# Patient Record
Sex: Female | Born: 2000 | Race: White | Hispanic: No | Marital: Single | Smoking: Never smoker
Health system: Southern US, Community
[De-identification: ages and names within clinical notes are randomized; demographics above are authoritative.]

## PROBLEM LIST (undated history)

## (undated) DIAGNOSIS — R112 Nausea with vomiting, unspecified: Secondary | ICD-10-CM

## (undated) DIAGNOSIS — S99919A Unspecified injury of unspecified ankle, initial encounter: Secondary | ICD-10-CM

## (undated) DIAGNOSIS — K9041 Non-celiac gluten sensitivity: Secondary | ICD-10-CM

## (undated) DIAGNOSIS — R011 Cardiac murmur, unspecified: Secondary | ICD-10-CM

## (undated) DIAGNOSIS — Z9889 Other specified postprocedural states: Secondary | ICD-10-CM

## (undated) DIAGNOSIS — R519 Headache, unspecified: Secondary | ICD-10-CM

## (undated) DIAGNOSIS — M25571 Pain in right ankle and joints of right foot: Secondary | ICD-10-CM

## (undated) DIAGNOSIS — M24071 Loose body in right ankle: Secondary | ICD-10-CM

## (undated) DIAGNOSIS — T148XXA Other injury of unspecified body region, initial encounter: Secondary | ICD-10-CM

## (undated) HISTORY — DX: Loose body in right ankle: M24.071

## (undated) HISTORY — DX: Pain in right ankle and joints of right foot: M25.571

---
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
## 2018-01-06 ENCOUNTER — Emergency Department
Admission: EM | Admit: 2018-01-06 | Discharge: 2018-01-06 | Disposition: A | Discharge status: Home / Self Care | Payer: 59 | Attending: Emergency Medicine | Admitting: Emergency Medicine

## 2018-01-06 ENCOUNTER — Emergency Department: Payer: 59

## 2018-01-06 ENCOUNTER — Encounter: Payer: Self-pay | Admitting: Emergency Medicine

## 2018-01-06 ENCOUNTER — Other Ambulatory Visit: Payer: Self-pay

## 2018-01-06 DIAGNOSIS — R51 Headache: Secondary | ICD-10-CM | POA: Diagnosis not present

## 2018-01-06 DIAGNOSIS — M542 Cervicalgia: Secondary | ICD-10-CM | POA: Insufficient documentation

## 2018-01-06 DIAGNOSIS — R519 Headache, unspecified: Secondary | ICD-10-CM

## 2018-01-06 DIAGNOSIS — R509 Fever, unspecified: Secondary | ICD-10-CM | POA: Insufficient documentation

## 2018-01-06 DIAGNOSIS — J029 Acute pharyngitis, unspecified: Secondary | ICD-10-CM | POA: Diagnosis not present

## 2018-01-06 LAB — CBC WITH DIFFERENTIAL/PLATELET
Abs Immature Granulocytes: 0.04 10*3/uL (ref 0.00–0.07)
BASOS ABS: 0 10*3/uL (ref 0.0–0.1)
Basophils Relative: 0 %
EOS ABS: 0.1 10*3/uL (ref 0.0–0.5)
EOS PCT: 1 %
HCT: 41 % (ref 36.0–46.0)
HEMOGLOBIN: 13.8 g/dL (ref 12.0–15.0)
Immature Granulocytes: 0 %
LYMPHS PCT: 4 %
Lymphs Abs: 0.4 10*3/uL — ABNORMAL LOW (ref 0.7–4.0)
MCH: 31.1 pg (ref 26.0–34.0)
MCHC: 33.7 g/dL (ref 30.0–36.0)
MCV: 92.3 fL (ref 80.0–100.0)
MONO ABS: 1 10*3/uL (ref 0.1–1.0)
Monocytes Relative: 10 %
NRBC: 0 % (ref 0.0–0.2)
Neutro Abs: 8.1 10*3/uL — ABNORMAL HIGH (ref 1.7–7.7)
Neutrophils Relative %: 85 %
Platelets: 149 10*3/uL — ABNORMAL LOW (ref 150–400)
RBC: 4.44 MIL/uL (ref 3.87–5.11)
RDW: 11.9 % (ref 11.5–15.5)
WBC: 9.6 10*3/uL (ref 4.0–10.5)

## 2018-01-06 LAB — URINALYSIS, COMPLETE (UACMP) WITH MICROSCOPIC
Bacteria, UA: NONE SEEN
Bilirubin Urine: NEGATIVE
Glucose, UA: NEGATIVE mg/dL
Ketones, ur: 20 mg/dL — AB
Leukocytes, UA: NEGATIVE
Nitrite: NEGATIVE
Protein, ur: NEGATIVE mg/dL
Specific Gravity, Urine: 1.014 (ref 1.005–1.030)
pH: 7 (ref 5.0–8.0)

## 2018-01-06 LAB — PROTEIN AND GLUCOSE, CSF
Glucose, CSF: 49 mg/dL (ref 40–70)
Total  Protein, CSF: 23 mg/dL (ref 15–45)

## 2018-01-06 LAB — CSF CELL COUNT WITH DIFFERENTIAL
EOS CSF: 0 %
Eosinophils, CSF: 0 %
Lymphs, CSF: 0 %
Lymphs, CSF: 0 %
MONOCYTE-MACROPHAGE-SPINAL FLUID: 0 %
Monocyte-Macrophage-Spinal Fluid: 0 %
OTHER CELLS CSF: 0
Other Cells, CSF: 0
RBC Count, CSF: 2 /mm3 (ref 0–3)
RBC Count, CSF: 2 /mm3 (ref 0–3)
SEGMENTED NEUTROPHILS-CSF: 0 %
Segmented Neutrophils-CSF: 0 %
Tube #: 1
Tube #: 3
WBC CSF: 0 /mm3 (ref 0–5)
WBC, CSF: 0 /mm3 (ref 0–5)

## 2018-01-06 LAB — GROUP A STREP BY PCR: Group A Strep by PCR: NOT DETECTED

## 2018-01-06 LAB — COMPREHENSIVE METABOLIC PANEL WITH GFR
ALT: 19 U/L (ref 0–44)
AST: 24 U/L (ref 15–41)
Albumin: 4.2 g/dL (ref 3.5–5.0)
Alkaline Phosphatase: 51 U/L (ref 38–126)
Anion gap: 9 (ref 5–15)
BUN: 9 mg/dL (ref 6–20)
CO2: 25 mmol/L (ref 22–32)
Calcium: 9.2 mg/dL (ref 8.9–10.3)
Chloride: 104 mmol/L (ref 98–111)
Creatinine, Ser: 0.87 mg/dL (ref 0.44–1.00)
GFR calc Af Amer: >60 mL/min (ref >60)
GFR calc non Af Amer: >60 mL/min (ref >60)
Glucose, Bld: 105 mg/dL — ABNORMAL HIGH (ref 70–99)
Potassium: 3.9 mmol/L (ref 3.5–5.1)
Sodium: 138 mmol/L (ref 135–145)
Total Bilirubin: 0.6 mg/dL (ref 0.3–1.2)
Total Protein: 7.2 g/dL (ref 6.5–8.1)

## 2018-01-06 LAB — MONONUCLEOSIS SCREEN: Mono Screen: NEGATIVE

## 2018-01-06 LAB — LACTIC ACID, PLASMA: Lactic Acid, Venous: 0.9 mmol/L (ref 0.5–1.9)

## 2018-01-06 LAB — INFLUENZA PANEL BY PCR (TYPE A & B)
INFLBPCR: NEGATIVE
Influenza A By PCR: NEGATIVE

## 2018-01-06 LAB — POCT PREGNANCY, URINE: Preg Test, Ur: NEGATIVE

## 2018-01-06 MED ORDER — DIPHENHYDRAMINE HCL 50 MG/ML IJ SOLN
12.5000 mg | Freq: Once | INTRAMUSCULAR | Status: AC
  Administered 2018-01-06: 12.5 mg via INTRAVENOUS
  Filled 2018-01-06: qty 1

## 2018-01-06 MED ORDER — ACETAMINOPHEN 325 MG PO TABS
650.0000 mg | ORAL_TABLET | Freq: Once | ORAL | Status: AC | PRN
  Administered 2018-01-06: 650 mg via ORAL

## 2018-01-06 MED ORDER — SODIUM CHLORIDE 0.9 % IV BOLUS
1000.0000 mL | Freq: Once | INTRAVENOUS | Status: AC
  Administered 2018-01-06: 1000 mL via INTRAVENOUS

## 2018-01-06 MED ORDER — PROCHLORPERAZINE MALEATE 5 MG PO TABS
5.0000 mg | ORAL_TABLET | Freq: Once | ORAL | Status: AC
  Administered 2018-01-06: 5 mg via ORAL
  Filled 2018-01-06: qty 1

## 2018-01-06 MED ORDER — LIDOCAINE HCL (PF) 1 % IJ SOLN
30.0000 mL | Freq: Once | INTRAMUSCULAR | Status: AC
  Administered 2018-01-06: 30 mL via INTRADERMAL
  Filled 2018-01-06: qty 30

## 2018-01-06 MED ORDER — ACETAMINOPHEN 325 MG PO TABS
ORAL_TABLET | ORAL | Status: AC
  Filled 2018-01-06: qty 2

## 2018-01-06 MED ORDER — IBUPROFEN 800 MG PO TABS
800.0000 mg | ORAL_TABLET | Freq: Once | ORAL | Status: AC
  Administered 2018-01-06: 800 mg via ORAL
  Filled 2018-01-06: qty 1

## 2018-01-06 MED ORDER — ONDANSETRON HCL 4 MG/2ML IJ SOLN
4.0000 mg | Freq: Once | INTRAMUSCULAR | Status: AC
  Administered 2018-01-06: 4 mg via INTRAVENOUS
  Filled 2018-01-06: qty 2

## 2018-01-06 MED ORDER — FENTANYL CITRATE (PF) 100 MCG/2ML IJ SOLN
50.0000 ug | Freq: Once | INTRAMUSCULAR | Status: AC
  Administered 2018-01-06: 50 ug via INTRAVENOUS
  Filled 2018-01-06: qty 2

## 2018-01-06 MED ORDER — KETOROLAC TROMETHAMINE 30 MG/ML IJ SOLN
30.0000 mg | Freq: Once | INTRAMUSCULAR | Status: AC
  Administered 2018-01-06: 30 mg via INTRAVENOUS
  Filled 2018-01-06: qty 1

## 2018-01-06 NOTE — Discharge Instructions (Addendum)
You may continue to take Tylenol and Motrin for pain and fever.  Drink plenty of fluid to stay well hydrated.  Return to the emergency department if you develop the inability to keep down fluids, severe pain, or any other symptoms concerning to you.

## 2018-01-06 NOTE — ED Provider Notes (Signed)
[facility] Emergency Department Provider Note  ____________________________________________  Time seen: Approximately 4:28 PM  I have reviewed the triage vital signs and the nursing notes.   HISTORY  Chief Complaint Fever    HPI Lauren Rasmussen is a 17 y.o. female, otherwise healthy, presenting from urgent care for fever, sore throat, headache, neck pain.  The patient reports that 4 days ago, she developed a headache similar to her prior migraines.  However, she took sumatriptan and Motrin and her headache did not resolve as her normal migraines would.  She then developed some posterior neck pain, low back pain, and since has developed some sore throat.  She has not had any congestion or rhinorrhea.  Today, when she took a shower, she had blackening of her vision without a true syncopal episode, and did have a vomiting episode.  She has had fevers as high as 103.0.  She has not been having any abdominal pain, diarrhea or constipation.  LMP was last week.  There is someone with mononucleosis living in her dorm.  No tick bites or travel outside the [place].  SH: Consulting civil engineer.  Denies sexual activity.  History reviewed. No pertinent past medical history.  There are no active problems to display for this patient.   History reviewed. No pertinent surgical history.  Current Outpatient Rx  . Order #: [id number] Class: Historical Med  . Order #: [id number] Class: Historical Med    Allergies Gluten meal  No family history on file.  Social History Social History   Tobacco Use  . Smoking status: Never Smoker  . Smokeless tobacco: Never Used  Substance Use Topics  . Alcohol use: Not Currently  . Drug use: Never    Review of Systems Constitutional: Positive fever and general malaise. Eyes: No visual changes.  No eye discharge. ENT: Positive sore throat. No congestion or rhinorrhea.  No ear pain. Cardiovascular: Denies chest pain. Denies  palpitations. Respiratory: Denies shortness of breath.  No cough. Gastrointestinal: No abdominal pain.  Positive nausea, positive vomiting.  No diarrhea.  No constipation. Genitourinary: Negative for dysuria.  No urinary frequency.  LMP last week. Musculoskeletal: Positive diffuse back pain and neck pain.. Skin: Negative for rash. Neurological: Positive for headache. No focal numbness, tingling or weakness.     ____________________________________________   PHYSICAL EXAM:  VITAL SIGNS: ED Triage Vitals  Enc Vitals Group     BP 01/06/18 1239 122/64     Pulse Rate 01/06/18 1239 (!) 124     Resp 01/06/18 1239 (!) 22     Temp 01/06/18 1239 (!) 100.9 F (38.3 C)     Temp Source 01/06/18 1239 Oral     SpO2 01/06/18 1239 100 %     Weight 01/06/18 1239 155 lb (70.3 kg)     Height 01/06/18 1239 5\' 8"  (1.727 m)     Head Circumference --      Peak Flow --      Pain Score 01/06/18 1242 10     Pain Loc --      Pain Edu? --      Excl. in GC? --     Constitutional: Alert and oriented. Answers questions appropriately.  The patient is mildly uncomfortable but able to move about without significant difficulty. Eyes: Conjunctivae are injected bilaterally..  EOMI. No scleral icterus.  No eye discharge. Head: Atraumatic. Nose: Positive congestion without rhinorrhea. Mouth/Throat: Mucous membranes are moist.  The patient has posterior pharyngeal erythema with tonsillar swelling and exudate seen on both  tonsils.  The posterior palate is symmetric and the uvula is midline. EARS: TMs are clear without bulge, erythema or fluid bilaterally.  The canals are clear as well. Neck: No stridor.  Supple.  The patient has mild discomfort but is able to range her head in all directions without any severe pain.  No true meningismus on examination. Cardiovascular: Normal rate, regular rhythm. No murmurs, rubs or gallops.  Respiratory: Normal respiratory effort.  No accessory muscle use or retractions. Lungs  CTAB.  No wheezes, rales or ronchi. Gastrointestinal: Soft, and nondistended.  Bilateral nonspecific upper abdominal discomfort on examination.  Negative Murphy sign.  No guarding or rebound.  No peritoneal signs. Musculoskeletal: No LE edema. Neurologic:  A&Ox3.  Speech is clear.  Face and smile are symmetric.  EOMI.  Moves all extremities well. Skin:  Skin is warm, dry and intact. No rash noted. Psychiatric: Mood and affect are normal. Speech and behavior are normal.  Normal judgement.  ____________________________________________   LABS (all labs ordered are listed, but only abnormal results are displayed)  Labs Reviewed  COMPREHENSIVE METABOLIC PANEL - Abnormal; Notable for the following components:      Result Value   Glucose, Bld 105 (*)    All other components within normal limits  CBC WITH DIFFERENTIAL/PLATELET - Abnormal; Notable for the following components:   Platelets 149 (*)    Neutro Abs 8.1 (*)    Lymphs Abs 0.4 (*)    All other components within normal limits  URINALYSIS, COMPLETE (UACMP) WITH MICROSCOPIC - Abnormal; Notable for the following components:   Color, Urine YELLOW (*)    APPearance CLEAR (*)    Hgb urine dipstick SMALL (*)    Ketones, ur 20 (*)    Non Squamous Epithelial PRESENT (*)    All other components within normal limits  CSF CELL COUNT WITH DIFFERENTIAL - Abnormal; Notable for the following components:   Color, CSF CLEAR (*)    Appearance, CSF COLORLESS (*)    All other components within normal limits  CSF CELL COUNT WITH DIFFERENTIAL - Abnormal; Notable for the following components:   Color, CSF CLEAR (*)    Appearance, CSF COLORLESS (*)    All other components within normal limits  GROUP A STREP BY PCR  CSF CULTURE  LACTIC ACID, PLASMA  INFLUENZA PANEL BY PCR (TYPE A & B)  MONONUCLEOSIS SCREEN  PROTEIN AND GLUCOSE, CSF  LACTIC ACID, PLASMA  HERPES SIMPLEX VIRUS(HSV) DNA BY PCR  POC URINE PREG, ED  POCT PREGNANCY, URINE    ____________________________________________  EKG  ED ECG REPORT I, Anne-Caroline Sharma Covert, the attending physician, personally viewed and interpreted this ECG.   Date: 01/06/2018  EKG Time: 1637  Rate: 87  Rhythm: normal sinus rhythm  Axis: normal  Intervals:none  ST&T Change: No STEMI  ____________________________________________  RADIOLOGY  Dg Chest 2 View  Result Date: 01/06/2018 CLINICAL DATA:  Back pain, headache, fever, chills and neck pain for the past 2-3 days. EXAM: CHEST - 2 VIEW COMPARISON:  None. FINDINGS: The heart size and mediastinal contours are within normal limits. Both lungs are clear. The visualized skeletal structures are unremarkable. IMPRESSION: Normal examination. Electronically Signed   By: Beckie Salts M.D.   On: 01/06/2018 13:13    ____________________________________________   PROCEDURES  Procedure(s) performed: None  .Lumbar Puncture Date/Time: 01/06/2018 10:00 PM Performed by: Rockne Menghini, MD Authorized by: Rockne Menghini, MD   Consent:    Consent obtained:  Verbal and written   Consent given  by:  Patient   Risks discussed:  Headache, bleeding, infection, pain and nerve damage   Alternatives discussed:  No treatment and observation Pre-procedure details:    Procedure purpose:  Diagnostic   Preparation: Patient was prepped and draped in usual sterile fashion   Anesthesia (see MAR for exact dosages):    Anesthesia method:  Local infiltration   Local anesthetic:  Lidocaine 1% w/o epi Procedure details:    Lumbar space:  L4-L5 interspace   Patient position:  R lateral decubitus   Needle gauge:  22   Needle type:  Spinal needle - Quincke tip   Ultrasound guidance: no     Number of attempts:  1   Opening pressure (cm H2O):  29 Post-procedure:    Puncture site:  Adhesive bandage applied and direct pressure applied   Patient tolerance of procedure:  Tolerated well, no immediate complications    Critical Care  performed: No ____________________________________________   INITIAL IMPRESSION / ASSESSMENT AND PLAN / ED COURSE  Pertinent labs & imaging results that were available during my care of the patient were reviewed by me and considered in my medical decision making (see chart for details).  17 y.o. female presenting with 4 days of progressively worsening headache that is not responsive to her normal migraine medications, fever to 103.0, neck discomfort and sore throat.  Overall, the patient's symptoms are most consistent with a URI and I do see evidence of erythema in the posterior pharynx.  At urgent care she did have a negative strep and influenza testing, but we will repeat her strep testing with an aggressive sample here today.  I will also check her for mononucleosis.  I have had a long discussion with the patient about meningitis, outlining viral versus bacterial meningitis.  If the patient's additional testing here is negative, including her urinalysis, and she continues to feel poorly, we will plan to proceed with lumbar puncture.  I had a thorough discussion with the patient about the risks and benefits of this procedure.  Plan reevaluation for final disposition.  ----------------------------------------- 7:42 PM on 01/06/2018 -----------------------------------------  The patient's urinalysis does not show UTI.  Her pregnancy test is also negative.  Her mono screen and strep test are also negative.  The patient states that her sore throat has improved but that her headache is completely unchanged despite fluids and Toradol.  I have spoken to the patient, as well as her mother, and will proceed with lumbar puncture to rule out meningitis today.  Additional symptomatic treatment has been ordered.  ----------------------------------------- 11:09 PM on 01/06/2018 -----------------------------------------  Patient reported that her sore throat was improving but that she was continuing to have  headache.  We did proceed with lumbar puncture, which did not yield any evidence of meningitis.  At this time, the patient is also developing some congestion with her sore throat.  Her headache is feeling better at this time.  We will plan to discharge the patient home with close follow-up with student health at [facility], and I did discuss return precautions.  I did speak with the patient's mother during her ED stay.  ____________________________________________  FINAL CLINICAL IMPRESSION(S) / ED DIAGNOSES  Final diagnoses:  Fever, unspecified fever cause  Nonintractable headache, unspecified chronicity pattern, unspecified headache type  Neck pain  Pharyngitis, unspecified etiology         NEW MEDICATIONS STARTED DURING THIS VISIT:  New Prescriptions   No medications on file      Rockne Menghini, MD 01/06/18  2311  

## 2018-01-06 NOTE — ED Notes (Signed)
Pt talking and laughing with friends

## 2018-01-06 NOTE — ED Triage Notes (Signed)
PT to ED sent from [facility] for poss meningitis. PT states back pain, headache, fever , chills and neck pain x2-3 days. VSS

## 2018-01-09 LAB — HERPES SIMPLEX VIRUS(HSV) DNA BY PCR
HSV 1 DNA: NEGATIVE
HSV 2 DNA: NEGATIVE

## 2018-01-10 LAB — CSF CULTURE W GRAM STAIN
Culture: NO GROWTH
Special Requests: NORMAL

## 2018-01-10 LAB — CSF CULTURE: GRAM STAIN: NONE SEEN

## 2018-10-06 ENCOUNTER — Encounter (INDEPENDENT_AMBULATORY_CARE_PROVIDER_SITE_OTHER): Payer: Self-pay

## 2018-10-06 ENCOUNTER — Ambulatory Visit (INDEPENDENT_AMBULATORY_CARE_PROVIDER_SITE_OTHER): Payer: No Typology Code available for payment source | Admitting: Nurse Practitioner

## 2018-10-06 ENCOUNTER — Ambulatory Visit (INDEPENDENT_AMBULATORY_CARE_PROVIDER_SITE_OTHER): Payer: No Typology Code available for payment source | Admitting: Radiology

## 2018-10-06 VITALS — BP 123/80 | HR 71 | Temp 99.0°F | Ht 68.0 in | Wt 145.0 lb

## 2018-10-06 DIAGNOSIS — Y92410 Unspecified street and highway as the place of occurrence of the external cause: Secondary | ICD-10-CM

## 2018-10-06 DIAGNOSIS — S8002XA Contusion of left knee, initial encounter: Secondary | ICD-10-CM

## 2018-10-06 DIAGNOSIS — S90812A Abrasion, left foot, initial encounter: Secondary | ICD-10-CM

## 2018-10-06 DIAGNOSIS — S80212A Abrasion, left knee, initial encounter: Secondary | ICD-10-CM

## 2018-10-06 DIAGNOSIS — S8992XA Unspecified injury of left lower leg, initial encounter: Secondary | ICD-10-CM

## 2018-10-06 DIAGNOSIS — W010XXA Fall on same level from slipping, tripping and stumbling without subsequent striking against object, initial encounter: Secondary | ICD-10-CM

## 2018-10-06 DIAGNOSIS — T07XXXA Unspecified multiple injuries, initial encounter: Secondary | ICD-10-CM

## 2018-10-06 DIAGNOSIS — S80211A Abrasion, right knee, initial encounter: Secondary | ICD-10-CM

## 2018-10-06 MED ORDER — CEPHALEXIN 500 MG PO CAPS
500.0000 mg | ORAL_CAPSULE | Freq: Two times a day (BID) | ORAL | 0 refills | Status: AC
Start: 2018-10-06 — End: 2018-10-16

## 2018-10-06 NOTE — Progress Notes (Signed)
[facility]  PROGRESS NOTE     Patient: Jaime Bird   Date of Service: 10/06/2018   MRN: [id number]       Jaime Bird is a 18 y.o. female      HISTORY     Chief Complaint   Patient presents with    Knee Injury     L knee injury. Pt tripped and has a laceration from last night. pt mentioned they cleaned and put OTC topical         Patient was out yesterday, slipped in flip flops and fell onto her knees  Abrasion to both knees, scabbing started  Abrasion to top of left foot  Left knee with unsuturable laceration  Washed with soap and water at home last night  Ice today      History provided by:  Patient  Language interpreter used: No    Knee Pain    The incident occurred 12 to 24 hours ago. The incident occurred in the street. The injury mechanism was a fall. The pain is present in the left knee. The quality of the pain is described as aching. The pain is at a severity of 3/10. The pain is mild. The pain has been constant since onset. Pertinent negatives include no inability to bear weight. She reports no foreign bodies present. The symptoms are aggravated by movement and palpation. She has tried ice and NSAIDs for the symptoms. The treatment provided mild relief.       Review of Systems   Constitutional: Negative for chills and fever.   HENT: Negative.    Eyes: Negative.    Respiratory: Negative for cough, chest tightness, shortness of breath and wheezing.    Cardiovascular: Negative for chest pain and palpitations.   Gastrointestinal: Negative for abdominal pain, diarrhea, nausea and vomiting.   Endocrine: Negative.    Genitourinary: Negative.    Musculoskeletal: Positive for arthralgias and myalgias. Negative for joint swelling.   Skin: Positive for color change and wound. Negative for rash.   Allergic/Immunologic: Negative for immunocompromised state.   Neurological: Negative.    Hematological: Does not bruise/bleed easily.   Psychiatric/Behavioral: Negative.        History:  Past Medical History:    Diagnosis Date    Arthralgia of right ankle     Fracture     Ankle, bilateral    Gluten intolerance     causing GI upset     Headache     about 2-3 weeks migraine, Also frequent regular head aches    Heart murmur     innocent murmur. cleared by cardiology    Loose body of ankle, right     Post-operative nausea and vomiting     Soft tissue injury of ankle        Past Surgical History:   Procedure Laterality Date    ARTHROSCOPY, ANKLE  07/14/2012    Procedure: ARTHROSCOPY, ANKLE;  Surgeon: Christeen Douglas, DPM;  Location: [facility] OR;  Service: Podiatry;  Laterality: Left;  LEFT ANKLE SCOPE; REPAIR OCD; REPAIR ANKLE LIGAMENT     ARTHROSCOPY, ANKLE Right 04/05/2014    Procedure: ARTHROSCOPY, ANKLE;  Surgeon: Christeen Douglas, DPM;  Location: [facility] OR;  Service: Podiatry;  Laterality: Right;  RIGHT ANKLE SCOPE; LATERAL ANKLE STABILIZATION    ARTHROSCOPY, ANKLE Right 04/18/2015    Procedure: ARTHROSCOPY, ANKLE;  Surgeon: Christeen Douglas, DPM;  Location: [facility] OR;  Service: Podiatry;  Laterality: Right;  RIGHT  SUBTALAR JOIN ARTHROSCOPY ANKLE    EGD  01/2014    ORIF, FOOT Left 01/02/2016    Procedure: ORIF, FOOT;  Surgeon: Christeen Douglas, DPM;  Location: [facility] OR;  Service: Podiatry;  Laterality: Left;  LEFT TALUS SUBCHONDROPLASTY (ANTEROMEDIAL)    REPAIR, ANKLE LIGAMENT  07/14/2012    Procedure: REPAIR, ANKLE LIGAMENT;  Surgeon: Christeen Douglas, DPM;  Location: [facility] OR;  Service: Podiatry;  Laterality: Left;    STABILIZATION, ANKLE, BROSTROM PROCEDURE Right 04/05/2014    Procedure: STABILIZATION, ANKLE, BROSTROM PROCEDURE;  Surgeon: Christeen Douglas, DPM;  Location: [facility] OR;  Service: Podiatry;  Laterality: Right;       Family History   Problem Relation Age of Onset    Anesthesia problems Mother        Social History     Tobacco Use    Smoking status: Never Smoker    Smokeless tobacco: Never Used   Substance Use Topics    Alcohol use: No    Drug use: No       History reviewed.         Current Outpatient Medications:     JUNEL FE 1/20 1-20 MG-MCG per tablet, TAKE 1 TABLET BY MOUTH  at Night, Disp: , Rfl: 3    cephalexin (KEFLEX) 500 MG capsule, Take 1 capsule (500 mg total) by mouth 2 (two) times daily for 10 days, Disp: 20 capsule, Rfl: 0    ondansetron (ZOFRAN-ODT) 4 MG disintegrating tablet, Take 1 tablet (4 mg total) by mouth every 6 (six) hours as needed for Nausea., Disp: 8 tablet, Rfl: 0    rizatriptan (MAXALT-MLT) 10 MG disintegrating tablet, Take 1 tablet (10 mg total) by mouth 2 (two) times daily as needed for Migraine (Avoid dosing >2 days/week).May repeat in 2 hours if needed, Disp: 9 tablet, Rfl: 3    SUMAtriptan (IMITREX) 50 MG tablet, , Disp: , Rfl:     Allergies   Allergen Reactions    Gluten Meal Other (See Comments)     GI Upset       Medications and Allergies reviewed.    PHYSICAL EXAM     Vitals:    10/06/18 1603   BP: 123/80   Pulse: 71   Temp: 99 F (37.2 C)   TempSrc: Tympanic   SpO2: 98%   Weight: 65.8 kg (145 lb)   Height: 1.727 m (5\' 8" )       Physical Exam   Nursing note and vitals reviewed.  Constitutional: She is oriented to person, place, and time. She appears well-developed and well-nourished. She appears distressed.   HENT:   Head: Normocephalic.   Eyes: Pupils are equal, round, and reactive to light.   Neck: Normal range of motion.   Cardiovascular: Normal rate, regular rhythm, S1 normal, S2 normal and normal heart sounds. Exam reveals no gallop and no friction rub.   No murmur heard.  Pulmonary/Chest: Effort normal and breath sounds normal. No respiratory distress. She has no wheezes. She has no rhonchi. She has no rales.   Musculoskeletal: Normal range of motion.         General: Tenderness present. No deformity or edema.   Neurological: She is alert and oriented to person, place, and time.   Skin: Skin is warm and dry. No rash noted.   Psychiatric: She has a normal mood and affect. Her behavior is normal. Judgment and thought content normal.          UCC COURSE  All films negative for fracture as interpreted by NP - will call patient if radiology read differs      Xr Knee Left 4+ Views    Result Date: 10/06/2018  History: Fall.      5 views of the left knee were obtained without comparison exam. The visualized bones, joint spaces and soft tissues appear unremarkable. Wallie Renshaw, DO  10/06/2018 4:45 PM        Orders Placed This Encounter   Medications    cephalexin (KEFLEX) 500 MG capsule     Sig: Take 1 capsule (500 mg total) by mouth 2 (two) times daily for 10 days     Dispense:  20 capsule     Refill:  0         PROCEDURES     Procedures       ASSESSMENT     Encounter Diagnoses   Name Primary?    Contusion of left knee, initial encounter Yes    Multiple abrasions           SSESSMENT    PLAN     Jaime Bird was seen today for knee injury.    Diagnoses and all orders for this visit:    Contusion of left knee, initial encounter  -     XR Knee Left 4+ Views; Future    Multiple abrasions    Other orders  -     cephalexin (KEFLEX) 500 MG capsule; Take 1 capsule (500 mg total) by mouth 2 (two) times daily for 10 days     Watch for infection   Wash with soap and water   Tylenol/ibuprofen   Ice   rest       Discussed results and diagnosis with patient/family.  Reviewed warning signs for worsening condition, as well as, indications for follow-up with pmd and return to urgent care clinic.   Patient/family expressed understanding of instructions.    Orders Placed This Encounter   Procedures    XR Knee Left 4+ Views         An After Visit Summary was printed and given to the patient.      Signed,  Rondall Allegra, FNP

## 2018-10-06 NOTE — Patient Instructions (Signed)
Dear De Blanch:    Thank you for choosing [facility]  [facility] [place]  [address]  [address]  [place] [address]  [phone].        I hope your visit today was EXCELLENT.     Specific instructions for your visit today:    Follow up with your primary care physician for ongoing care.  Please go to the ER for worsening symptoms or concerns.  Please read all of your discharge instructions     You were evaluated for left knee pain/abrasion after a fall  Your knee xray is normal  Wash daily with antibacterial soap and water, apply neosporin and cover with bandage  Observe for signs of infection to include fever > 100.4, increased pain, redness, swelling or drainage  Take all antibiotics as prescribed  Ice  Rest  Tylenol/ibuprofen    Follow up with ortho or primary care as needed      Lower Extremity Bruise  You have a bruise (contusion on a leg, knee, ankle, foot, or toe. Symptoms include pain, swelling, and skin discoloration. No bones are broken. This injury may take from a few days to a few weeks to heal. During that time, the bruise may change from reddish in color, to purple-blue, to green-yellow, to yellow-brown.   Home care   Unless another medicine was prescribed, you can take acetaminophen, ibuprofen, or naproxento control pain. Talk with your healthcare provider before using these medicines if you have chronic liver or kidney disease or ever had a stomach ulcer or digestive bleeding.   Elevate the injured area to reduce pain and swelling.As much as possible, sit or lie down with the injured area raised about the level of your heart. This is especially important during the first 48 hours.   Ice the injured area to help reduce pain and swelling.Wrap an ice packor ice cubes in a plastic bag in a thin towel. Apply to the bruised area for 20 minutes every 1 to 2 hours the first day. Continue this 3 to 4 times a day until the pain and swelling goes away.   If  crutches have been advised, don't bear full weight on the injured leg until you can do so without pain. You may return to sports when you are able to put full weight and impact on the injured leg without pain.    Follow up  Follow up with your healthcare provider, or as advised. Call if you are not improving within the next1 to 2 weeks.   When to seek medical advice  Call your healthcare provider right away if any of these occur:   Increased pain or swelling   Foot or toes becomecold, blue, numb or tingly   Signs of infection:Warmth, drainage, or increased redness or pain around the injury   Inability to move the injured area, or any joints below the injured area   Frequent bruising for unknown reasons  [employer]Well last reviewed this educational content on 10/08/2017   2000-2020 [employer], [place]. [address] [place], [place] [address]. All rights reserved. This information is not intended as a substitute for professional medical care. Always follow your healthcare professional's instructions.        Abrasions  Abrasions are skin scrapes. Their treatment depends on how large and deep the abrasion is.   Home care  You may be prescribed an antibiotic cream or ointment to apply to the wound. This helps prevent infection. Follow instructions  when using this medicine.   General care   To care for the abrasion, do the following each day for as long as directed by your healthcare provider:  ? If you were given a bandage, change it once a day. If your bandage sticks to the wound, soak it in warm water until it loosens.  ? Wash the area with soap and warm water. You may do this in a sink or under a tub faucet or shower. Rinse off the soap. Then pat the area dry with a clean towel.  ? If antibiotic ointment or cream was prescribed, reapply it to the wound as directed. Cover the wound with a fresh nonstick bandage. If the bandage becomes wet or dirty, change it as soon as possible.  ? Some antibiotic ointments  or cream can cause an allergic reaction or dermatitis. This may cause redness, itching and or hives. If this occurs, stop using the ointment right away and wash off any remaining ointment. You may need to take some allergy medicine to relieve symptoms.   You may use acetaminophen or ibuprofen to control pain unless another pain medicine was prescribed.Talk with your healthcare provider before using these medicines if you have chronic liver or kidney disease or ever had a stomach ulcer or GI (gastrointestinal) bleeding. Dont use ibuprofen in children younger than 95 months old.   Most skin wounds heal within 10 days. But an infection may occur even with treatment. So its important to watch the wound for signs of infection as listed below.    Follow-up care  Follow up with your healthcare provider, or as advised.  When to get medical advice  Call your healthcare provider right away if any of these occur:   Fever of 100.76F (38C) or higher, or as directed by your healthcare provider   Increasing pain, redness, swelling, or drainage from the wound   Bleeding from the wound that does not stop after a few minutes of steady, firm pressure   Decreased ability to move any body part near the wound  StayWell last reviewed this educational content on 10/08/2017   2000-2020 [employer], [place]. [address], [place], [place] [address]. All rights reserved. This information is not intended as a substitute for professional medical care. Always follow your healthcare professional's instructions.          If you do not continue to improve or your condition worsens, please contact your doctor or GO immediAtely to the Emergency Department.    Sincerely,  Janice Coffin, FNP-BC  Family Nurse Practitioner

## 2019-01-05 ENCOUNTER — Other Ambulatory Visit: Payer: Self-pay | Admitting: *Deleted

## 2019-01-05 DIAGNOSIS — Z20822 Contact with and (suspected) exposure to covid-19: Secondary | ICD-10-CM

## 2019-01-07 LAB — NOVEL CORONAVIRUS, NAA: SARS-CoV-2, NAA: DETECTED — AB

## 2019-03-22 IMAGING — CR DG CHEST 2V
1 series · 2 of 2 positions shown · non-contrast
Comparison: None.

CLINICAL DATA: Back pain, headache, fever, chills and neck pain for
the past 2-3 days.

EXAM:
CHEST - 2 VIEW

[Series 1: dg chest 2 view · 0.14mm/px · 2 of 2 slices shown]
[im 1/2]
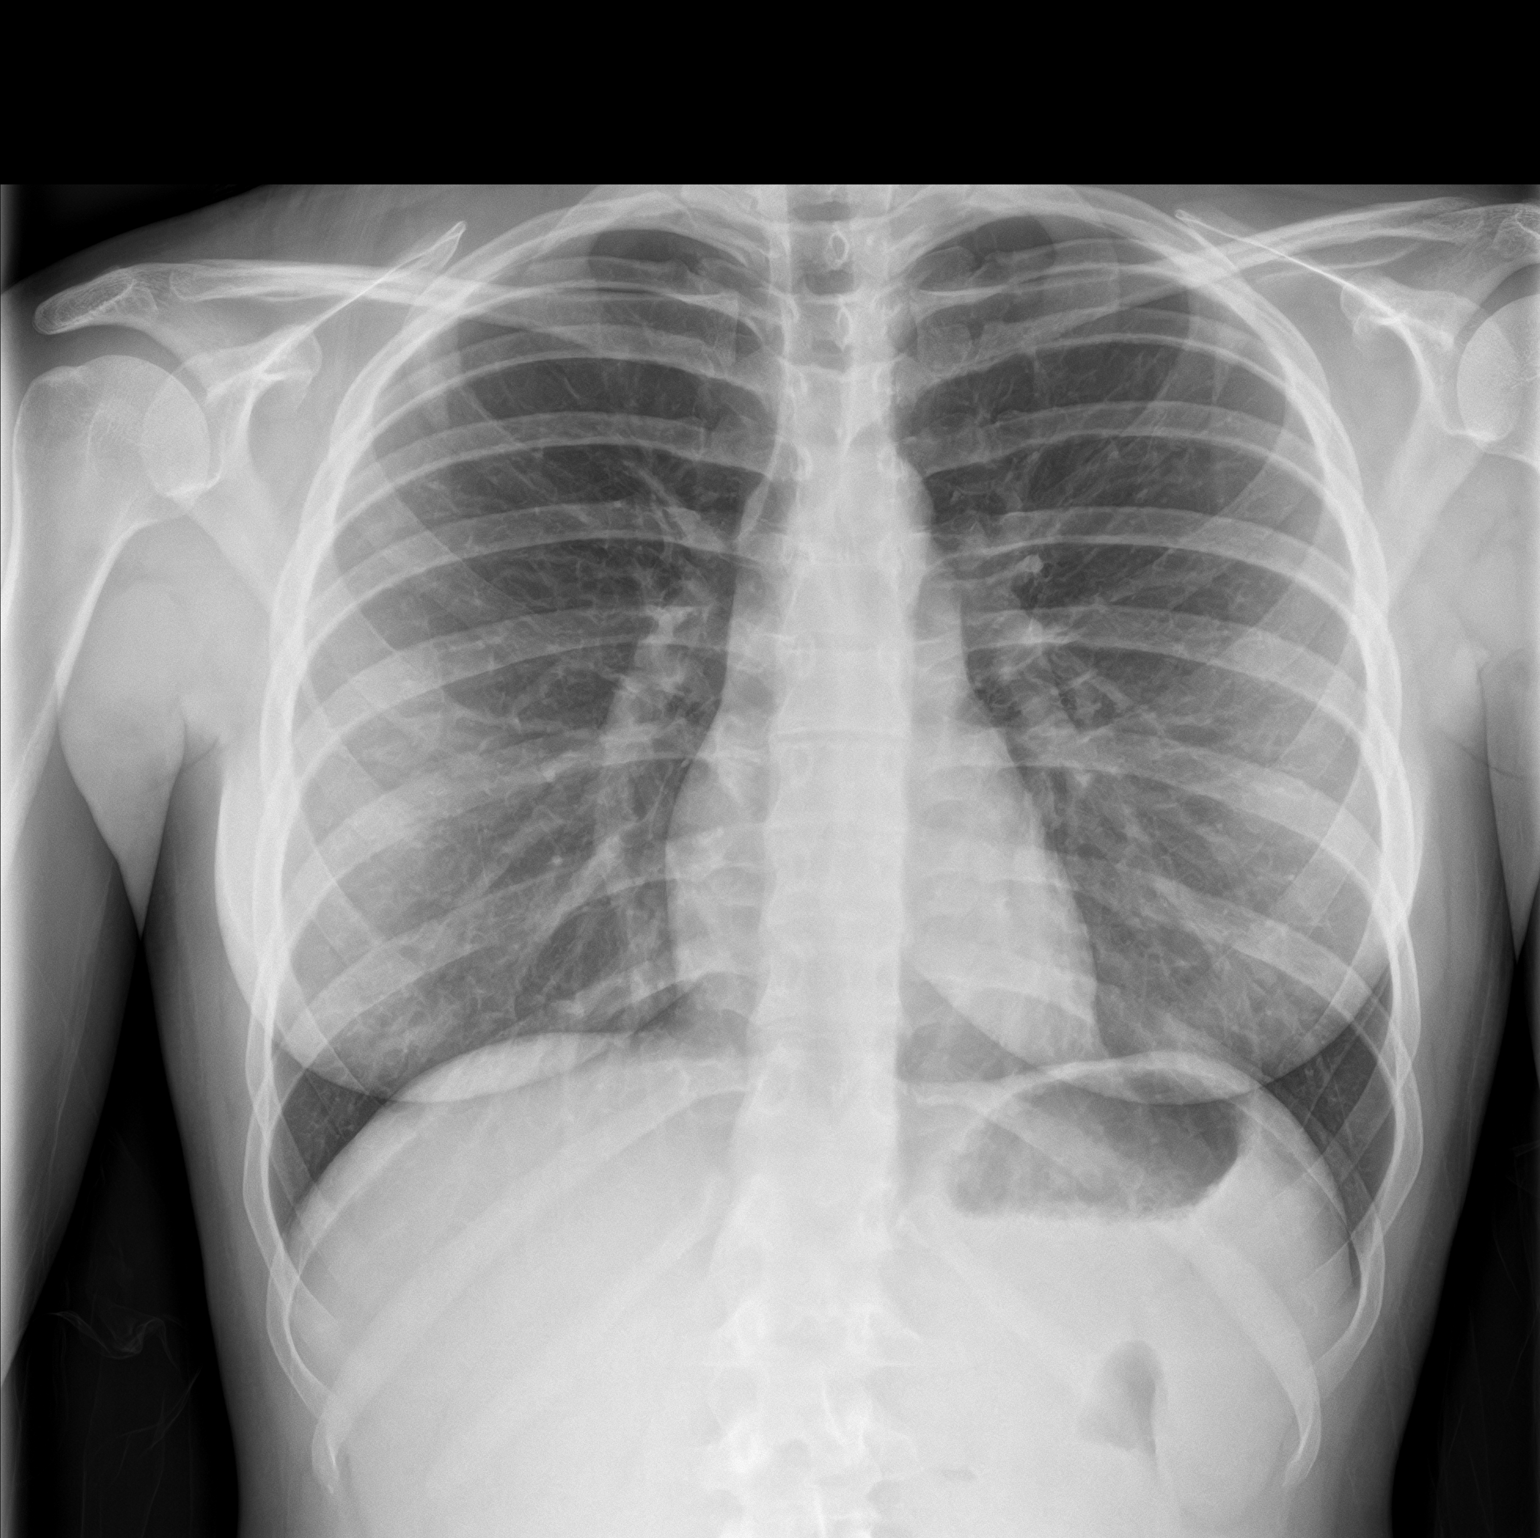
[im 2/2]
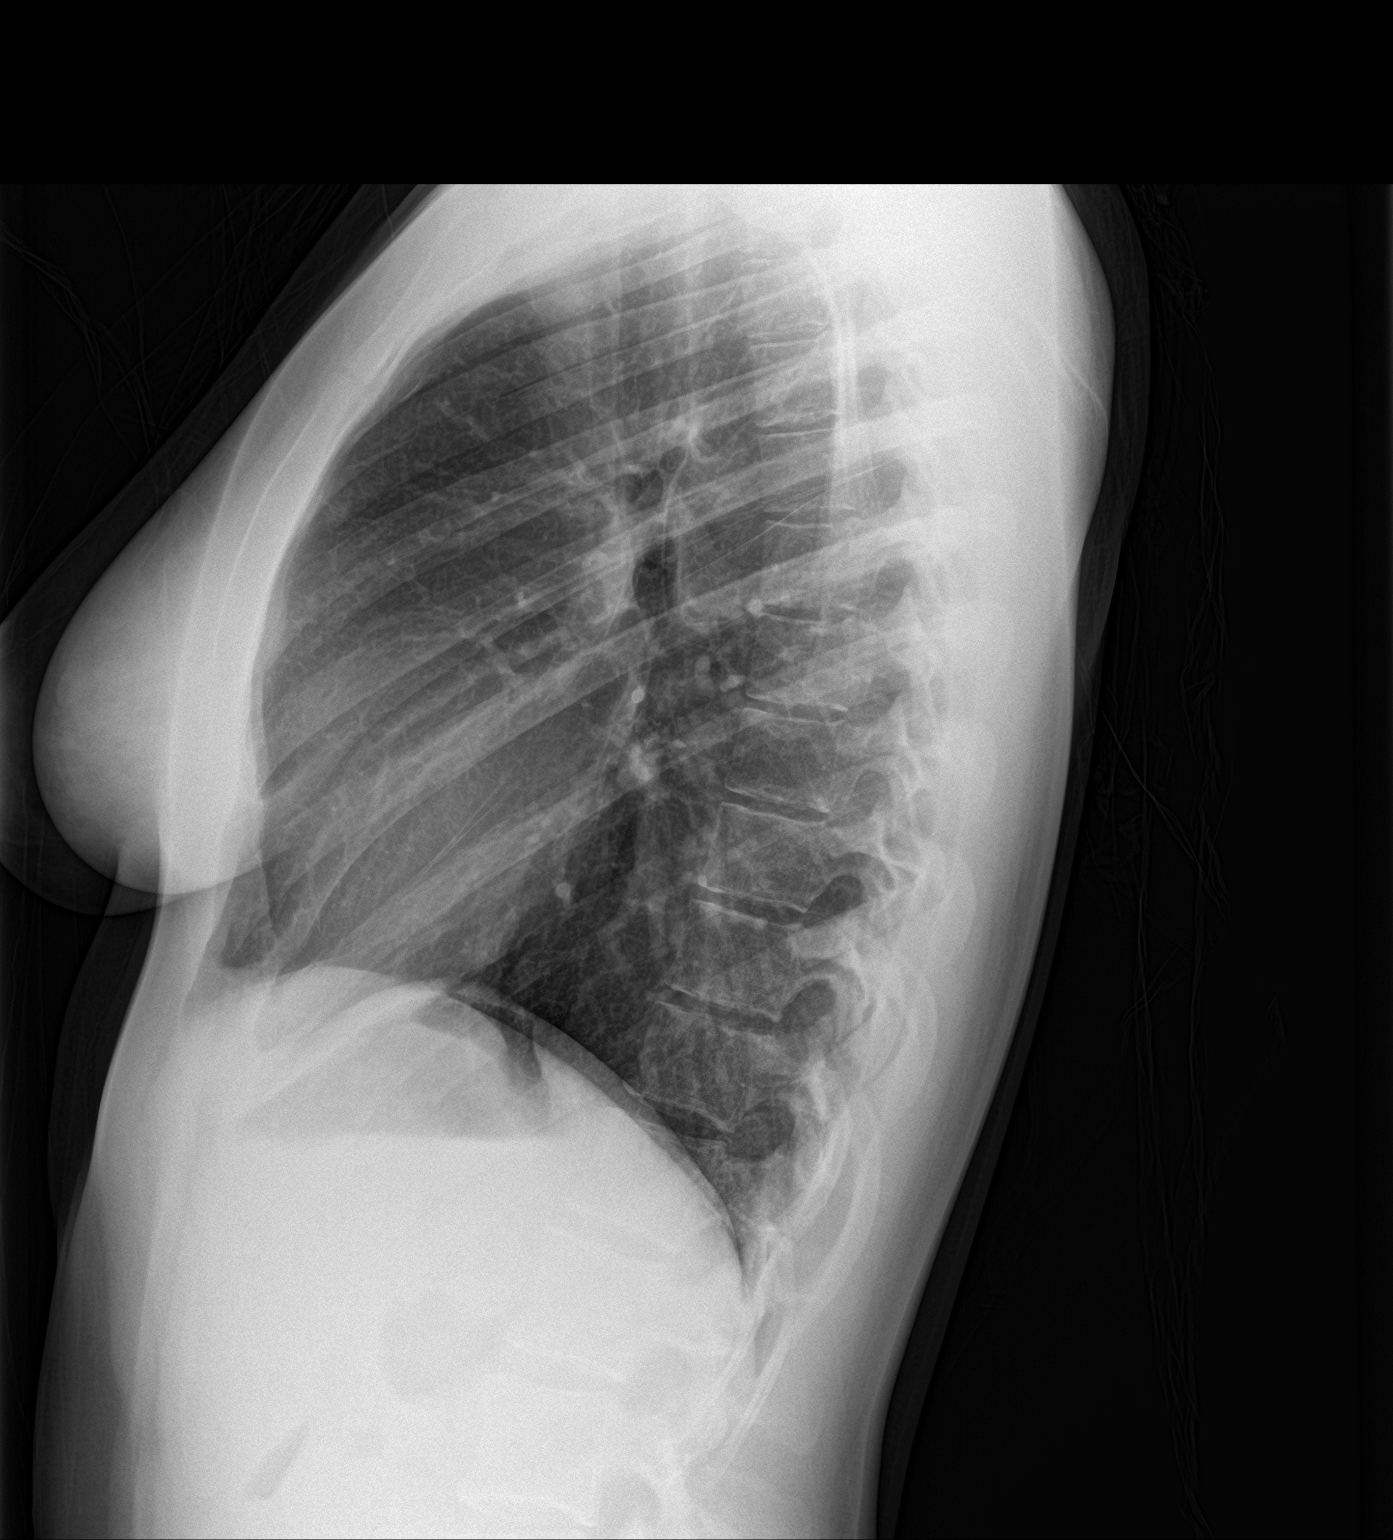

[2 of 2 positions shown; findings below may reference images not displayed]

FINDINGS: The heart size and mediastinal contours are within normal limits.
Both lungs are clear. The visualized skeletal structures are
unremarkable.
IMPRESSION: Normal examination.

## 2020-02-23 ENCOUNTER — Other Ambulatory Visit: Payer: Self-pay | Admitting: Physician Assistant

## 2021-08-24 ENCOUNTER — Emergency Department
Admission: EM | Admit: 2021-08-24 | Discharge: 2021-08-24 | Disposition: A | Discharge status: Home / Self Care | Payer: No Typology Code available for payment source | Attending: Emergency Medicine | Admitting: Emergency Medicine

## 2021-08-24 DIAGNOSIS — K529 Noninfective gastroenteritis and colitis, unspecified: Secondary | ICD-10-CM | POA: Insufficient documentation

## 2021-08-24 DIAGNOSIS — E860 Dehydration: Secondary | ICD-10-CM | POA: Insufficient documentation

## 2021-08-24 LAB — CBC AND DIFFERENTIAL
Absolute NRBC: 0 10*3/uL (ref 0.00–0.00)
Basophils Absolute Automated: 0.02 10*3/uL (ref 0.00–0.08)
Basophils Automated: 0.3 %
Eosinophils Absolute Automated: 0 10*3/uL (ref 0.00–0.44)
Eosinophils Automated: 0 %
Hematocrit: 42 % (ref 34.7–43.7)
Hgb: 14.3 g/dL (ref 11.4–14.8)
Immature Granulocytes Absolute: 0.01 10*3/uL (ref 0.00–0.07)
Immature Granulocytes: 0.1 %
Instrument Absolute Neutrophil Count: 6.68 10*3/uL — ABNORMAL HIGH (ref 1.10–6.33)
Lymphocytes Absolute Automated: 0.31 10*3/uL — ABNORMAL LOW (ref 0.42–3.22)
Lymphocytes Automated: 4.2 %
MCH: 32 pg (ref 25.1–33.5)
MCHC: 34 g/dL (ref 31.5–35.8)
MCV: 94 fL (ref 78.0–96.0)
MPV: 10.9 fL (ref 8.9–12.5)
Monocytes Absolute Automated: 0.42 10*3/uL (ref 0.21–0.85)
Monocytes: 5.6 %
Neutrophils Absolute: 6.68 10*3/uL — ABNORMAL HIGH (ref 1.10–6.33)
Neutrophils: 89.8 %
Nucleated RBC: 0 /100 WBC (ref 0.0–0.0)
Platelets: 207 10*3/uL (ref 142–346)
RBC: 4.47 10*6/uL (ref 3.90–5.10)
RDW: 12 % (ref 11–15)
WBC: 7.44 10*3/uL (ref 3.10–9.50)

## 2021-08-24 LAB — COMPREHENSIVE METABOLIC PANEL
ALT: 19 U/L (ref 0–55)
AST (SGOT): 24 U/L (ref 5–41)
Albumin/Globulin Ratio: 1.6 (ref 0.9–2.2)
Albumin: 4.1 g/dL (ref 3.5–5.0)
Alkaline Phosphatase: 66 U/L (ref 37–117)
Anion Gap: 11 (ref 5.0–15.0)
BUN: 11 mg/dL (ref 7.0–21.0)
Bilirubin, Total: 0.9 mg/dL (ref 0.2–1.2)
CO2: 24 mEq/L (ref 17–29)
Calcium: 9.3 mg/dL (ref 8.5–10.5)
Chloride: 104 mEq/L (ref 99–111)
Creatinine: 0.8 mg/dL (ref 0.4–1.0)
Globulin: 2.6 g/dL (ref 2.0–3.6)
Glucose: 99 mg/dL (ref 70–100)
Potassium: 3.5 mEq/L (ref 3.5–5.3)
Protein, Total: 6.7 g/dL (ref 6.0–8.3)
Sodium: 139 mEq/L (ref 135–145)
eGFR: >60 mL/min/{1.73_m2} (ref >=60)

## 2021-08-24 LAB — URINALYSIS REFLEX TO MICROSCOPIC EXAM - REFLEX TO CULTURE
Bilirubin, UA: NEGATIVE
Blood, UA: NEGATIVE
Glucose, UA: NEGATIVE
Ketones UA: 20 — AB
Leukocyte Esterase, UA: NEGATIVE
Nitrite, UA: NEGATIVE
Protein, UR: 30 — AB
Specific Gravity UA: 1.033 (ref 1.001–1.035)
Urine pH: 6 (ref 5.0–8.0)
Urobilinogen, UA: NORMAL mg/dL (ref 0.2–2.0)

## 2021-08-24 LAB — HCG, SERUM, QUALITATIVE: Hcg Qualitative: NEGATIVE

## 2021-08-24 LAB — LIPASE: Lipase: 13 U/L (ref 8–78)

## 2021-08-24 MED ORDER — SODIUM CHLORIDE 0.9 % IV BOLUS
1000.0000 mL | Freq: Once | INTRAVENOUS | Status: AC
Start: 2021-08-24 — End: 2021-08-24
  Administered 2021-08-24: 1000 mL via INTRAVENOUS

## 2021-08-24 MED ORDER — ONDANSETRON HCL 4 MG/2ML IJ SOLN
4.0000 mg | Freq: Once | INTRAMUSCULAR | Status: AC
Start: 2021-08-24 — End: 2021-08-24
  Administered 2021-08-24: 4 mg via INTRAVENOUS
  Filled 2021-08-24: qty 2

## 2021-08-24 NOTE — Discharge Instructions (Signed)
Dear Jaime Bird,    You were seen today by Deborah Chalk, PA-C. Thank you for choosing the [facility] for your healthcare needs.  We hope your visit today was EXCELLENT.    Drink lots of fluids.  Take zofran as needed for nausea.  Follow up with your doctor on Monday.  Please take any medications prescribed as directed.   Return to the emergency department for any new or worsening symptoms.    If you have any questions or concerns, I am available at [phone]. Please do not hesitate to contact me if I can be of assistance.     Below is some information and resources that our patients often find helpful.    Sincerely,    Deborah Chalk, Physician Assistant  [facility] - Department of Emergency Medicine    ________________________________________________________________      Thank you for choosing [facility] for your emergency care needs.  We strive to provide EXCELLENT care to you and your family.      DOCTOR REFERRALS  Call [phone] if you need any further referrals and we can help you find a primary care doctor or specialist.  Also, available online at:  [email or web address]    YOUR CONTACT INFORMATION  Before leaving please check with registration to make sure we have an up-to-date contact number.  You can call registration at [phone] to update your information.  For questions about your hospital bill, please call [phone].  For questions about your Emergency Dept Physician bill please call [phone].      FREE HEALTH SERVICES  If you need help with health or social services, please call 2-1-1 for a free referral to resources in your area.  2-1-1 is a free service connecting people with information on health insurance, free clinics, pregnancy, mental health, dental care, food assistance, housing, and substance abuse counseling.  Also, available online at:  http://[email or web address].org    MEDICAL RECORDS AND  TESTS  Certain laboratory test results do not come back the same day, for example urine cultures.  We will contact you if other important findings are noted.  Radiology films are often reviewed again to ensure accuracy.  If there is any discrepancy, we will notify you.      Please call [phone] to pick up a complimentary CD of any radiology studies performed.  If you or your doctor would like to request a copy of your medical records, please call [phone].      ORTHOPEDIC INJURY   Please know that significant injuries can exist even when an initial x-ray is read as normal or negative.  This can occur because some fractures (broken bones) are not initially visible on x-rays.  For this reason, close outpatient follow-up with your primary care doctor or bone specialist (orthopedist) is required.    MEDICATIONS AND FOLLOWUP  Please be aware that some prescription medications can cause drowsiness.  Use caution when driving or operating machinery.    The examination and treatment you have received in our Emergency Department is provided on an emergency basis, and is not intended to be a substitute for your primary care physician.  It is important that your doctor checks you again and that you report any new or remaining problems at that time.      LOCAL PHARMACIES  [facility] - [address], [place], [place] [address] (1.4 miles, 7 minutes)  [facility] - [address], [place],  [place] [address] (6.5 miles, 13 minutes)  Handout with directions available on request    PATIENT RELATIONS  If you have any concerns, issues, or feedback related to your care, positive or negative, please do not hesitate to contact Patient Relations at [phone]. They are open from 8:30AM-5:00PM Monday through Friday.

## 2021-08-24 NOTE — ED Provider Notes (Signed)
[facility]        [facility]  EMERGENCY DEPARTMENT HISTORY AND PHYSICAL EXAM    Patient Information     Patient Name: Jaime Bird, Jaime Bird  Encounter Date:  08/24/2021  Patient DOB:  03/02/2001  MRN:  [id number]  Room:  16/B16  Rendering Provider: Delice Bison A. Jeannetta Nap, PA-C, CAQ-EM    History of Presenting Illness     Chief Complaint: n/v/d  Historian: pt      HPI Comments:   21 y.o. female h/o migraines, heart murmur, gluten intolerance, ADHD, depression c/o gradual onset of nausea, vomiting, and diarrhea x20 hours.  Patient states that she first started not feeling well yesterday around 3 PM.  She states since that time she has had approximately 15 episodes of non-bloody emesis and about 5 episodes of nonbloody diarrhea.  Patient also states she has had some diffuse cramping throughout her abdomen.  Patient states that she was just traveling in [place] and had eaten out quite a bit.  She states that she normally adheres to a vegan diet and thinks perhaps she may have eaten something that was not vegan.  She thinks that could have potentially caused her symptoms, however, she states it does not usually last this long.  She has had a little bit of a cough recently.  She denies any fever, sore throat, chest pain, shortness of breath, hematemesis, hematochezia, dysuria, vaginal bleeding or discharge.  No ill contacts.  No other complaints.  Of note, the patient recently returned from traveling to four different countries in [place].  She states that prior to going on her trip at the end of May she took a course of amoxicillin for an ear infection which she completed while traveling.      PMD: Pilar Jarvis, MD    Past Medical History     Past Medical History:   Diagnosis Date    Arthralgia of right ankle     Fracture     Ankle, bilateral    Gluten intolerance     causing GI upset     Headache     about 2-3 weeks migraine, Also frequent regular head aches    Heart murmur     innocent  murmur. cleared by cardiology    Loose body of ankle, right     Post-operative nausea and vomiting     Soft tissue injury of ankle    ADHD  Depression     Past Surgical History     Past Surgical History:   Procedure Laterality Date    ARTHROSCOPY, ANKLE  07/14/2012    Procedure: ARTHROSCOPY, ANKLE;  Surgeon: Christeen Douglas, DPM;  Location: [facility] OR;  Service: Podiatry;  Laterality: Left;  LEFT ANKLE SCOPE; REPAIR OCD; REPAIR ANKLE LIGAMENT     ARTHROSCOPY, ANKLE Right 04/05/2014    Procedure: ARTHROSCOPY, ANKLE;  Surgeon: Christeen Douglas, DPM;  Location: [facility] OR;  Service: Podiatry;  Laterality: Right;  RIGHT ANKLE SCOPE; LATERAL ANKLE STABILIZATION    ARTHROSCOPY, ANKLE Right 04/18/2015    Procedure: ARTHROSCOPY, ANKLE;  Surgeon: Christeen Douglas, DPM;  Location: [facility] OR;  Service: Podiatry;  Laterality: Right;  RIGHT SUBTALAR JOIN ARTHROSCOPY ANKLE    EGD  01/2014    ORIF, FOOT Left 01/02/2016    Procedure: ORIF, FOOT;  Surgeon: Christeen Douglas, DPM;  Location: [facility] OR;  Service: Podiatry;  Laterality: Left;  LEFT TALUS SUBCHONDROPLASTY (ANTEROMEDIAL)    REPAIR, ANKLE LIGAMENT  07/14/2012    Procedure: REPAIR, ANKLE LIGAMENT;  Surgeon: Christeen Douglas, DPM;  Location: [facility] OR;  Service: Podiatry;  Laterality: Left;    STABILIZATION, ANKLE, BROSTROM PROCEDURE Right 04/05/2014    Procedure: STABILIZATION, ANKLE, BROSTROM PROCEDURE;  Surgeon: Christeen Douglas, DPM;  Location: [facility] OR;  Service: Podiatry;  Laterality: Right;       Family History     Family History   Problem Relation Age of Onset    Anesthesia problems Mother        Social History     Social History     Socioeconomic History    Marital status: Single     Spouse name: Not on file    Number of children: Not on file    Years of education: Not on file    Highest education level: Not on file   Occupational History    Not on file   Tobacco Use    Smoking status: Never    Smokeless tobacco: Never   Vaping Use    Vaping status: Not  on file   Substance and Sexual Activity    Alcohol use: Yes    Drug use: No    Sexual activity: Not on file   Other Topics Concern    Not on file   Social History Narrative    Not on file     Social Determinants of Health     Financial Resource Strain: Not on file   Food Insecurity: Not on file   Transportation Needs: Not on file   Physical Activity: Not on file   Stress: Not on file   Social Connections: Not on file   Intimate Partner Violence: Not on file   Housing Stability: Not on file       Allergies     Allergies   Allergen Reactions    Gluten Meal Other (See Comments)     GI Upset       Home Medications     Prior to Admission medications    Medication Sig Start Date End Date Taking? Authorizing Provider   JUNEL FE 1/20 1-20 MG-MCG per tablet TAKE 1 TABLET BY MOUTH  at Night 01/16/15   [provider]   ondansetron (ZOFRAN-ODT) 4 MG disintegrating tablet Take 1 tablet (4 mg total) by mouth every 6 (six) hours as needed for Nausea. 05/03/17   Al-Kadiri, Shirline Frees, MD   rizatriptan (MAXALT-MLT) 10 MG disintegrating tablet Take 1 tablet (10 mg total) by mouth 2 (two) times daily as needed for Migraine (Avoid dosing >2 days/week).May repeat in 2 hours if needed 10/08/15   Albin Fischer, MD   SUMAtriptan (IMITREX) 50 MG tablet  10/06/18   [provider]         Review of Systems     Please refer to HPI for detailed ROS.    Physical Exam     Patient Vitals for the past 24 hrs:   BP Temp Temp src Pulse Resp SpO2 Weight   08/24/21 1000 123/74 -- -- (!) 105 -- 99 % --   08/24/21 0946 94/59 98.4 F (36.9 C) Oral (!) 110 18 99 % --   08/24/21 0941 -- -- -- -- -- -- 62.6 kg     Physical Exam   Nursing note and vitals reviewed.   Constitutional: Pt is oriented to person, place, and time. Pt appears well-developed and well-nourished. Afebrile. No distress.   HENT:     Head: Normocephalic and  atraumatic.     Right Ear: External ear normal.     Left Ear: External ear normal.     Mouth/Throat: Slightly dry.  Normal speech.   Eyes: Conjunctivae normal. No drainage. EOMI.  Neck: Normal range of motion.   Cardiovascular: Tachycardic. Normal S1, S2. No murmur noted.    Pulmonary/Chest: Vesicular breath sounds. No respiratory distress. O2 sat 99% on RA.   Abdominal: Bowel sounds are normal. Abdomen soft. Mildly TTP in the epigastric area and in the RLQ. No RUQ TTP. No rebound or guarding.   Neurological: Pt is alert and oriented to person, place, and time. GCS 15.  Skin: Skin is warm and dry. Normal skin turgor.   Psychiatric: Normal mood and affect. Behavior is normal.       Orders Placed During This Encounter     Orders Placed This Encounter   Procedures    CBC and differential    Comprehensive metabolic panel    Lipase    Urinalysis Reflex to Microscopic Exam- Reflex to Culture    Beta HCG, Qual, Serum    Diet NPO effective now    Saline lock IV       ED Medications Administered     ED Medication Orders (From admission, onward)      Start Ordered     Status Ordering Provider    08/24/21 1031 08/24/21 1030  sodium chloride 0.9 % bolus 1,000 mL  Once        Route: Intravenous  Ordered Dose: 1,000 mL       Last MAR action: New Bag Imari Sivertsen A    08/24/21 1031 08/24/21 1030  ondansetron (ZOFRAN) injection 4 mg  Once        Route: Intravenous  Ordered Dose: 4 mg       Ordered Deaven Urwin A            Diagnostic Study Results and Data Review     The results of the diagnostic studies below were reviewed by the ED provider:    Labs  Results       Procedure Component Value Units Date/Time    Comprehensive metabolic panel [[id number]] Collected: 08/24/21 0956    Specimen: Blood Updated: 08/24/21 1027     Glucose 99 mg/dL      BUN 78.2 mg/dL      Creatinine 0.8 mg/dL      Sodium 956 mEq/L      Potassium 3.5 mEq/L      Chloride 104 mEq/L      CO2 24 mEq/L      Calcium 9.3 mg/dL      Protein, Total 6.7 g/dL      Albumin 4.1 g/dL      AST (SGOT) 24 U/L      ALT 19 U/L      Alkaline Phosphatase 66 U/L      Bilirubin, Total 0.9 mg/dL       Globulin 2.6 g/dL      Albumin/Globulin Ratio 1.6     Anion Gap 11.0     eGFR >60.0 mL/min/1.73 m2     Lipase [[id number]] Collected: 08/24/21 0956    Specimen: Blood Updated: 08/24/21 1027     Lipase 13 U/L     Urinalysis Reflex to Microscopic Exam- Reflex to Culture [[id number]]  (Abnormal) Collected: 08/24/21 1003     Updated: 08/24/21 1020     Urine Type Urine, Clean Ca     Color, UA Yellow  Clarity, UA Clear     Specific Gravity UA 1.033     Urine pH 6.0     Leukocyte Esterase, UA Negative     Nitrite, UA Negative     Protein, UR 30     Glucose, UA Negative     Ketones UA 20     Urobilinogen, UA Normal mg/dL      Bilirubin, UA Negative     Blood, UA Negative     RBC, UA 26 - 50 /hpf      WBC, UA 0 - 5 /hpf      Squamous Epithelial Cells, Urine 0 - 5 /hpf      Hyaline Casts, UA 0 - 3 /lpf     CBC and differential [[id number]]  (Abnormal) Collected: 08/24/21 0956    Specimen: Blood Updated: 08/24/21 1011     WBC 7.44 x10 3/uL      Hgb 14.3 g/dL      Hematocrit 40.9 %      Platelets 207 x10 3/uL      RBC 4.47 x10 6/uL      MCV 94.0 fL      MCH 32.0 pg      MCHC 34.0 g/dL      RDW 12 %      MPV 10.9 fL      Instrument Absolute Neutrophil Count 6.68 x10 3/uL      Neutrophils 89.8 %      Lymphocytes Automated 4.2 %      Monocytes 5.6 %      Eosinophils Automated 0.0 %      Basophils Automated 0.3 %      Immature Granulocytes 0.1 %      Nucleated RBC 0.0 /100 WBC      Neutrophils Absolute 6.68 x10 3/uL      Lymphocytes Absolute Automated 0.31 x10 3/uL      Monocytes Absolute Automated 0.42 x10 3/uL      Eosinophils Absolute Automated 0.00 x10 3/uL      Basophils Absolute Automated 0.02 x10 3/uL      Immature Granulocytes Absolute 0.01 x10 3/uL      Absolute NRBC 0.00 x10 3/uL             Radiologic Studies  Radiology Results (24 Hour)       ** No results found for the last 24 hours. **              Abnormal results/incidental findings discussed with pt and/or family: ***       MDM and Clinical Notes     Working  Differential (not completely inclusive): ***, etc    Nursing records reviewed and agree: Yes      Clinical Notes: ***    White count 7k  H/H wnl  Platelets normal   Cr wnl  LFTs, bili and lipase nomral  UA shows blood, but no signs of infxn         Re-Eval: Re-eval at     Consult Conversation: n/a    Personal Protective Equipment:  I was wearing appropriate personal protective equipment at the time of patient evaluation and for all face-to-face interactions:     Prescriptions       New Prescriptions    No medications on file       Diagnosis and Disposition     Clinical Impression:  No diagnosis found.  Final diagnoses:   None       Disposition:  ED Disposition  None                  Rendering Provider: Cyndy Freeze. Jeannetta Nap, PA-C, CAQ-EM    Attending's signature signifies review of the provider note and clinical impression.      This note was generated by the [facility] EMR system/Dragon speech recognition and may contain inherent errors or omissions not intended by the user. Grammatical errors, random word insertions, deletions, pronoun errors and incomplete sentences are occasional consequences of this technology due to software limitations. Not all errors are caught or corrected. If there are questions or concerns about the content of this note or information contained within the body of this dictation they should be addressed directly with the author for clarification.

## 2023-04-21 ENCOUNTER — Ambulatory Visit (INDEPENDENT_AMBULATORY_CARE_PROVIDER_SITE_OTHER): Payer: No Typology Code available for payment source | Admitting: Family Medicine

## 2023-04-21 ENCOUNTER — Encounter (INDEPENDENT_AMBULATORY_CARE_PROVIDER_SITE_OTHER): Payer: Self-pay | Admitting: Family Medicine

## 2023-04-21 VITALS — BP 122/77 | HR 72 | Temp 98.2°F | Ht 67.0 in | Wt 147.0 lb

## 2023-04-21 DIAGNOSIS — F900 Attention-deficit hyperactivity disorder, predominantly inattentive type: Secondary | ICD-10-CM | POA: Insufficient documentation

## 2023-04-21 DIAGNOSIS — G43001 Migraine without aura, not intractable, with status migrainosus: Secondary | ICD-10-CM | POA: Insufficient documentation

## 2023-04-21 DIAGNOSIS — E611 Iron deficiency: Secondary | ICD-10-CM

## 2023-04-21 DIAGNOSIS — F32A Depression, unspecified: Secondary | ICD-10-CM | POA: Insufficient documentation

## 2023-04-21 DIAGNOSIS — Z0000 Encounter for general adult medical examination without abnormal findings: Secondary | ICD-10-CM

## 2023-04-21 MED ORDER — SUMATRIPTAN SUCCINATE 50 MG PO TABS
25.0000 mg | ORAL_TABLET | ORAL | 2 refills | Status: AC | PRN
Start: 2023-04-21 — End: ?

## 2023-04-21 NOTE — Progress Notes (Signed)
 Subjective:      Patient ID: Jaime Bird is a 24 y.o. female    Chief Complaint   Patient presents with    Annual Exam     Pt is currently fasting.         HPI    Jaime Bird is a 23 y.o. female who is a new patient at our clinic presenting for annual examination, fasting labs;    Patient recently moved from [place]  to [place]  ([place] city) on 02/2023.  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    Reviewed other chronic medical issues.    ADHD, longstanding history of ADD/ADHD.  -Well-controlled symptoms on Vyvanse.  -She follows up with psychiatrist who has been prescribing Vyvanse.  -Reviewed use and pressure side effect of Vyvanse in detail.  Patient verbally understands    Depression, longstanding problem of depression.  -Doing well on Prozac.  She has been taking this medication since age of 36.  Tolerating well  -Not suicidal or homicidal.  Denies panic attack.  Sleeps well at night.    Migraine headache, overall symptom has improved with less frequent migraine headache flare  -Usually ibuprofen helps during flare.  Occasionally she does take sumatriptan .  Prescription refilled today.    Patient would like to check iron level, history of low iron in past.  History of anemia.  Denies menorrhagia or any other sources of bleeding.  Currently asymptomatic.    Pap, Pap was done 12/2022 at [facility] by OB/GYN,  -Pap was negative (reviewed at EMR).  -Menstrual cycle regular.  No menorrhagia or dysmenorrhea.  -No children yet.    Immunization, per patient possibly Tdap up-to-date, she will check records and if not getting menstrual schedule Tdap at our clinic or outside pharmacy.  Flu vaccine up-to-date.  Per patient she received COVID-vaccine with 1 or 2 booster dose and declined further booster dose.  Other childhood/adolescent vaccine up-to-date per patient.    The following portions of the  patient's history were reviewed and updated as appropriate: current medications, allergies, past family history, past medical history, past social history, past surgical history and problem list.     Review of Systems     Problem List[1]  Medical History[2]  Past Surgical History[3]  Family History[4]  Social History[5]  Current Medications[6]  Allergies[7]         Objective:     BP 122/77 (BP Site: Left arm, Patient Position: Sitting, Cuff Size: Medium)   Pulse 72   Temp 98.2 F (36.8 C)   Ht 1.702 m (5' 7)   Wt 66.7 kg (147 lb)   SpO2 98%   BMI 23.02 kg/m      Physical Exam     Assessment/Plan:     23 y.o. female presents for physical and fasting blood work      Discussed the patient's BMI with her.  Body mass index is 23.02 kg/m.   HIgh BMI Follow Up  Encouragement to Exercise  Nutrition and Physical Activity Counseling:  Nutrition Counseling:  Food education, guidance and counseling  Physical Activity Counseling  Strength training exercise promotion    Counseling: Counseling/Anticipatory Guidance (female 76-39): Nutrition, family planning/contraception, physical activity, healthy weight, injury prevention, misuse of tobacco, alcohol and drugs, sexual behavior and STDs, dental health, mental health, immunizations, screenings.       Health maintenance:  -Fasting blood work: today, reviewed planned labs and  pt agrees to them.   -Immunizations:  per patient possibly Tdap up-to-date, she will check records and if not getting menstrual schedule Tdap at our clinic or outside pharmacy.  Flu vaccine up-to-date.  Per patient she received COVID-vaccine with 1 or 2 booster dose and declined further booster dose.  Other childhood/adolescent vaccine up-to-date per patient.  -STD screen: Patient did not request STD screening.  Denies any high risk sexual behavior.  Denies any associated symptoms.  -Pap: Pap was done 12/2022 at [facility] by OB/GYN,  -Pap was negative (reviewed at EMR).  -Mammogram: No previous  history breast images.  Denies any first-degree family member with breast cancer.  -Colonoscopy: No previous history of EGD/colonoscopy.  Patient denies any GI related symptoms.  Denies any first-degree family member with history of colon cancer or GI related cancer.  -Vision: Within one year.  She does have corrective lens which she uses as needed basis.  -Dental: Within one  year    ADHD, longstanding history of ADD/ADHD.  -Well-controlled symptoms on Vyvanse.  -She follows up with psychiatrist who has been prescribing Vyvanse.  -Reviewed use and pressure side effect of Vyvanse in detail.  Patient verbally understands    Depression, longstanding problem of depression.  -Doing well on Prozac.  She has been taking this medication since age of 51.  Tolerating well  -Not suicidal or homicidal.  Denies panic attack.  Sleeps well at night.    Migraine headache, overall symptom has improved with less frequent migraine headache flare  -Usually ibuprofen helps during flare.  Occasionally she does take sumatriptan .  Prescription refilled today.    Call if you develop any new or worsening symptoms  Proceed to urgent care or ED after hours if necessary    Antonia Majors, MD        Return in about 1 year (around 04/20/2024).           [1] There is no problem list on file for this patient.   [2]   Past Medical History:  Diagnosis Date    Arthralgia of right ankle     Fracture     Ankle, bilateral    Gluten intolerance     causing GI upset     Headache     about 2-3 weeks migraine, Also frequent regular head aches    Heart murmur     innocent murmur. cleared by cardiology    Loose body of ankle, right     Post-operative nausea and vomiting     Soft tissue injury of ankle    [3]   Past Surgical History:  Procedure Laterality Date    ARTHROSCOPY, ANKLE  07/14/2012    Procedure: ARTHROSCOPY, ANKLE;  Surgeon: Bernardino Gordy BIRCH, DPM;  Location: [facility] OR;  Service: Podiatry;  Laterality: Left;  LEFT ANKLE SCOPE; REPAIR OCD; REPAIR ANKLE  LIGAMENT     ARTHROSCOPY, ANKLE Right 04/05/2014    Procedure: ARTHROSCOPY, ANKLE;  Surgeon: Bernardino Gordy BIRCH, DPM;  Location: [facility] OR;  Service: Podiatry;  Laterality: Right;  RIGHT ANKLE SCOPE; LATERAL ANKLE STABILIZATION    ARTHROSCOPY, ANKLE Right 04/18/2015    Procedure: ARTHROSCOPY, ANKLE;  Surgeon: Bernardino Gordy BIRCH, DPM;  Location: [facility] OR;  Service: Podiatry;  Laterality: Right;  RIGHT SUBTALAR JOIN ARTHROSCOPY ANKLE    EGD  01/2014    ORIF, FOOT Left 01/02/2016    Procedure: ORIF, FOOT;  Surgeon: Bernardino Gordy BIRCH, DPM;  Location: [facility] OR;  Service: Podiatry;  Laterality: Left;  LEFT TALUS SUBCHONDROPLASTY (ANTEROMEDIAL)    PRIMARY REPAIR, DISRUPTED LIGAMENT, ANKLE, COLLATERAL Right 04/05/2014    Procedure: STABILIZATION, ANKLE, BROSTROM PROCEDURE;  Surgeon: Bernardino Gordy BIRCH, DPM;  Location: [facility] OR;  Service: Podiatry;  Laterality: Right;    REPAIR, ANKLE LIGAMENT  07/14/2012    Procedure: REPAIR, ANKLE LIGAMENT;  Surgeon: Bernardino Gordy BIRCH, DPM;  Location: [facility] OR;  Service: Podiatry;  Laterality: Left;   [4]   Family History  Problem Relation Name Age of Onset    Anesthesia problems Mother     [5]   Social History  Socioeconomic History    Marital status: Single   Tobacco Use    Smoking status: Never    Smokeless tobacco: Never   Substance and Sexual Activity    Alcohol use: Yes    Drug use: No   [6]   Current Outpatient Medications   Medication Sig Dispense Refill    FLUoxetine (PROzac) 40 MG capsule Take 2 capsules (80 mg) by mouth      lisdexamfetamine (VYVANSE) 20 MG capsule Take 1 capsule (20 mg) by mouth every morning      JUNEL FE 1/20 1-20 MG-MCG per tablet TAKE 1 TABLET BY MOUTH  at Night (Patient not taking: Reported on 04/21/2023)  3    ondansetron  (ZOFRAN -ODT) 4 MG disintegrating tablet Take 1 tablet (4 mg total) by mouth every 6 (six) hours as needed for Nausea. (Patient not taking: Reported on 04/21/2023) 8 tablet 0    rizatriptan  (MAXALT -MLT) 10 MG disintegrating tablet  Take 1 tablet (10 mg total) by mouth 2 (two) times daily as needed for Migraine (Avoid dosing >2 days/week).May repeat in 2 hours if needed (Patient not taking: Reported on 04/21/2023) 9 tablet 3    SUMAtriptan  (IMITREX ) 50 MG tablet Take 0.5 tablets (25 mg) by mouth every 2 (two) hours as needed for Migraine Do not take more than 4 tab/day 8 tablet 2     No current facility-administered medications for this visit.   [7]   Allergies  Allergen Reactions    Gluten Meal Other (See Comments)     GI Upset

## 2023-04-21 NOTE — Progress Notes (Signed)
Have you seen any specialists/other providers since your last visit with [place]?    No      The patient was informed that the following HM items are still outstanding:   There are no preventive care reminders to display for this patient.

## 2023-04-22 ENCOUNTER — Other Ambulatory Visit (INDEPENDENT_AMBULATORY_CARE_PROVIDER_SITE_OTHER): Payer: Self-pay | Admitting: Family Medicine

## 2023-04-22 DIAGNOSIS — R319 Hematuria, unspecified: Secondary | ICD-10-CM

## 2023-04-22 LAB — CBC AND DIFFERENTIAL
Baso(Absolute): 0 10*3/uL (ref 0.0–0.2)
Basophils Automated: 1 %
Eosinophils Absolute: 0.1 10*3/uL (ref 0.0–0.4)
Eosinophils Automated: 3 %
Hematocrit: 38.3 % (ref 34.0–46.6)
Hemoglobin: 13 g/dL (ref 11.1–15.9)
Immature Granulocytes Absolute: 0 10*3/uL (ref 0.0–0.1)
Immature Granulocytes: 0 %
Lymphocytes Absolute: 1.3 10*3/uL (ref 0.7–3.1)
Lymphocytes Automated: 32 %
MCH: 31.7 pg (ref 26.6–33.0)
MCHC: 33.9 g/dL (ref 31.5–35.7)
MCV: 93 fL (ref 79–97)
Monocytes Absolute: 0.4 10*3/uL (ref 0.1–0.9)
Monocytes: 9 %
Neutrophils Absolute Count: 2.3 10*3/uL (ref 1.4–7.0)
Neutrophils: 55 %
Platelets: 202 10*3/uL (ref 150–450)
RBC: 4.1 x10E6/uL (ref 3.77–5.28)
RDW: 11.5 % — ABNORMAL LOW (ref 11.7–15.4)
WBC: 4.2 10*3/uL (ref 3.4–10.8)

## 2023-04-22 LAB — URINALYSIS WITH MICROSCOPIC EXAM
Bilirubin, UA: NEGATIVE
Glucose, UA: NEGATIVE
Leukocyte Esterase, UA: NEGATIVE
Nitrite, UA: NEGATIVE
Specific Gravity UA: 1.026 (ref 1.005–1.030)
Urine pH: 5.5 (ref 5.0–7.5)
Urobilinogen, UA: 1 mg/dL (ref 0.2–1.0)

## 2023-04-22 LAB — REFLEX - MICROSCOPIC EXAMINATION
Casts, UA: NONE SEEN /[LPF]
WBC, UA: NONE SEEN /[HPF] (ref 0–5)

## 2023-04-22 LAB — IRON PROFILE
Iron Saturation: 41 % (ref 15–55)
Iron: 116 ug/dL (ref 27–159)
TIBC: 286 ug/dL (ref 250–450)
UIBC: 170 ug/dL (ref 131–425)

## 2023-04-22 LAB — COMPREHENSIVE METABOLIC PANEL
ALT: 14 [IU]/L (ref 0–32)
AST (SGOT): 16 [IU]/L (ref 0–40)
Albumin: 4.4 g/dL (ref 4.0–5.0)
Alkaline Phosphatase: 67 [IU]/L (ref 44–121)
BUN / Creatinine Ratio: 10 (ref 9–23)
BUN: 7 mg/dL (ref 6–20)
Bilirubin, Total: 0.5 mg/dL (ref 0.0–1.2)
CO2: 21 mmol/L (ref 20–29)
Calcium: 9.3 mg/dL (ref 8.7–10.2)
Chloride: 104 mmol/L (ref 96–106)
Creatinine: 0.73 mg/dL (ref 0.57–1.00)
Globulin, Total: 1.9 g/dL (ref 1.5–4.5)
Glucose: 79 mg/dL (ref 70–99)
Potassium: 3.8 mmol/L (ref 3.5–5.2)
Protein, Total: 6.3 g/dL (ref 6.0–8.5)
Sodium: 141 mmol/L (ref 134–144)
eGFR: 118 mL/min/{1.73_m2} (ref >59)

## 2023-04-22 LAB — LIPID PANEL
Cholesterol / HDL Ratio: 1.8 {ratio} (ref 0.0–4.4)
Cholesterol: 139 mg/dL (ref 100–199)
HDL: 76 mg/dL (ref >39)
LDL Chol Calculated (NIH): 52 mg/dL (ref 0–99)
Triglycerides: 51 mg/dL (ref 0–149)
VLDL Calculated: 11 mg/dL (ref 5–40)

## 2023-04-22 LAB — HEMOGLOBIN A1C: Hemoglobin A1C: 5 % (ref 4.8–5.6)

## 2023-04-22 LAB — TSH: TSH: 1.82 u[IU]/mL (ref 0.450–4.500)

## 2023-04-22 NOTE — Progress Notes (Signed)
 Please inform patient    -Blood count unremarkable. No anemia detected.  -Urine analysis noted positive for blood; Recommending repeat UA whens he is not having menstrual bleeding; I will place lab order.  -Kidney,liver function normal;Electrolytes normal range;  -Thyroid lab normal range;  -Cholesterol good range; Recommending low cholesterol diet and exercises regular basis.  -Sugar at good range and no diabetes detected.  -Iron level at good range;

## 2023-04-26 ENCOUNTER — Other Ambulatory Visit (INDEPENDENT_AMBULATORY_CARE_PROVIDER_SITE_OTHER): Payer: Self-pay | Admitting: Family Medicine

## 2023-04-26 DIAGNOSIS — R319 Hematuria, unspecified: Secondary | ICD-10-CM

## 2023-05-05 ENCOUNTER — Other Ambulatory Visit: Payer: BC Managed Care – PPO

## 2023-05-11 ENCOUNTER — Other Ambulatory Visit: Payer: BC Managed Care – PPO

## 2023-05-21 ENCOUNTER — Encounter (INDEPENDENT_AMBULATORY_CARE_PROVIDER_SITE_OTHER): Payer: Self-pay | Admitting: Family Medicine

## 2023-05-21 DIAGNOSIS — E611 Iron deficiency: Secondary | ICD-10-CM
